# Patient Record
Sex: Male | Born: 2000 | Race: Black or African American | Hispanic: No | Marital: Single | Smoking: Never smoker
Health system: Southern US, Community
[De-identification: ages and names within clinical notes are randomized; demographics above are authoritative.]

---
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
## 2024-05-16 ENCOUNTER — Emergency Department (HOSPITAL_COMMUNITY)
Admission: EM | Admit: 2024-05-16 | Discharge: 2024-05-16 | Disposition: A | Discharge status: Home / Self Care | Payer: Self-pay | Attending: Emergency Medicine | Admitting: Emergency Medicine

## 2024-05-16 ENCOUNTER — Other Ambulatory Visit: Payer: Self-pay

## 2024-05-16 DIAGNOSIS — F19920 Other psychoactive substance use, unspecified with intoxication, uncomplicated: Secondary | ICD-10-CM | POA: Insufficient documentation

## 2024-05-16 NOTE — ED Triage Notes (Signed)
 Pt BIB [facility] from neighbors front yard. Pt on schizophrenic meds. Pt reports smoking delta 9 today. Pt seeking psych evaluation  120/70 16 97% RA 113CBG 86HR

## 2024-05-16 NOTE — ED Provider Notes (Signed)
 [place] EMERGENCY DEPARTMENT AT [facility] Provider Note   CSN: [id number] Arrival date & time: 05/16/24  9681     History No chief complaint on file.   HPI Robert Cooper is a 23 y.o. male presenting for bizarre behavior.  Known history of schizophrenia. Patient reported smoking marijuana tonight and then he does not recall much of the rest of the evening.  EMS reports that patient was in a neighbors yard just laying in the yard. Patient states he just feels tired and wants to go home and go to bed.  He denies audiovisual hallucinations.  States he feels better.  Denies fevers chills nausea vomiting shortness of breath.  Patient states that he feels comfortable discharge at any time.   Patient's recorded medical, surgical, social, medication list and allergies were reviewed in the Snapshot window as part of the initial history.   Review of Systems   Review of Systems  Constitutional:  Negative for chills and fever.  HENT:  Negative for ear pain and sore throat.   Eyes:  Negative for pain and visual disturbance.  Respiratory:  Negative for cough and shortness of breath.   Cardiovascular:  Negative for chest pain and palpitations.  Gastrointestinal:  Negative for abdominal pain and vomiting.  Genitourinary:  Negative for dysuria and hematuria.  Musculoskeletal:  Negative for arthralgias and back pain.  Skin:  Negative for color change and rash.  Neurological:  Negative for seizures and syncope.  All other systems reviewed and are negative.   Physical Exam Updated Vital Signs BP 114/71   Pulse 81   Temp 97.6 F (36.4 C) (Oral)   Resp 16   SpO2 97%  Physical Exam Vitals and nursing note reviewed.  Constitutional:      General: He is not in acute distress.    Appearance: He is well-developed.  HENT:     Head: Normocephalic and atraumatic.  Eyes:     Conjunctiva/sclera: Conjunctivae normal.  Cardiovascular:     Rate and Rhythm: Normal rate and regular  rhythm.     Heart sounds: No murmur heard. Pulmonary:     Effort: Pulmonary effort is normal. No respiratory distress.     Breath sounds: Normal breath sounds.  Abdominal:     Palpations: Abdomen is soft.     Tenderness: There is no abdominal tenderness.  Musculoskeletal:        General: No swelling.     Cervical back: Neck supple.  Skin:    General: Skin is warm and dry.     Capillary Refill: Capillary refill takes less than 2 seconds.  Neurological:     Mental Status: He is alert.  Psychiatric:        Mood and Affect: Mood normal.      ED Course/ Medical Decision Making/ A&P    Procedures Procedures   Medications Ordered in ED Medications - No data to display  Medical Decision Making:   23 year old male brought in for psychiatric evaluation.  Endorses substance use tonight. Was behaving bizarrely in a neighbors yard.  Likely secondary to intoxication rather than gross decompensation. His states that he has been stable on his mental health care for years denies any new symptoms.  Denying suicidal ideation, audiovisual hallucinations, command hallucinations or any other symptoms at this time. Overall, patient compliant with all exam steps in no acute distress. Underlying pathology including medical pathology is considered inconsistent.  Patient not decompensated at this time stable to follow-up in the outpatient setting  with his rather psychiatric care team. Patient stated his plan was to go home and go to sleep.  Clinical Impression:  1. Drug intoxication without complication [facility])      Discharge   Final Clinical Impression(s) / ED Diagnoses Final diagnoses:  Drug intoxication without complication [facility])    Rx / DC Orders ED Discharge Orders     None         Jerral Meth, MD 05/16/24 [phone]
